# Patient Record
Sex: Male | Born: 1995 | Race: White | Hispanic: No | Marital: Single | Smoking: Current every day smoker
Health system: Southern US, Community
[De-identification: ages and names within clinical notes are randomized; demographics above are authoritative.]

---
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
## 2015-03-19 IMAGING — CR DG HAND COMPLETE 3+V*L*
5 series · 5 of 5 positions shown · non-contrast
Comparison: None.

CLINICAL DATA: Left hand pain.

EXAM:
LEFT HAND - COMPLETE 3+ VIEW

[view not recorded (1 of 5)]
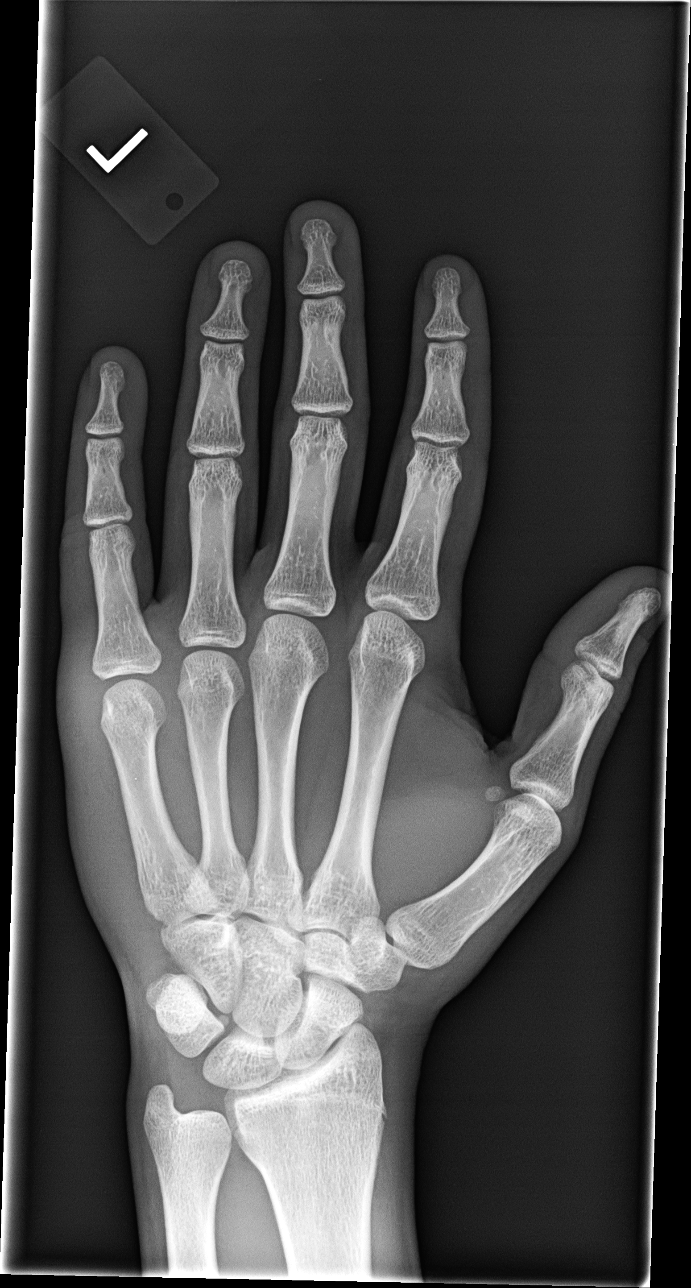

[view not recorded (2 of 5)]
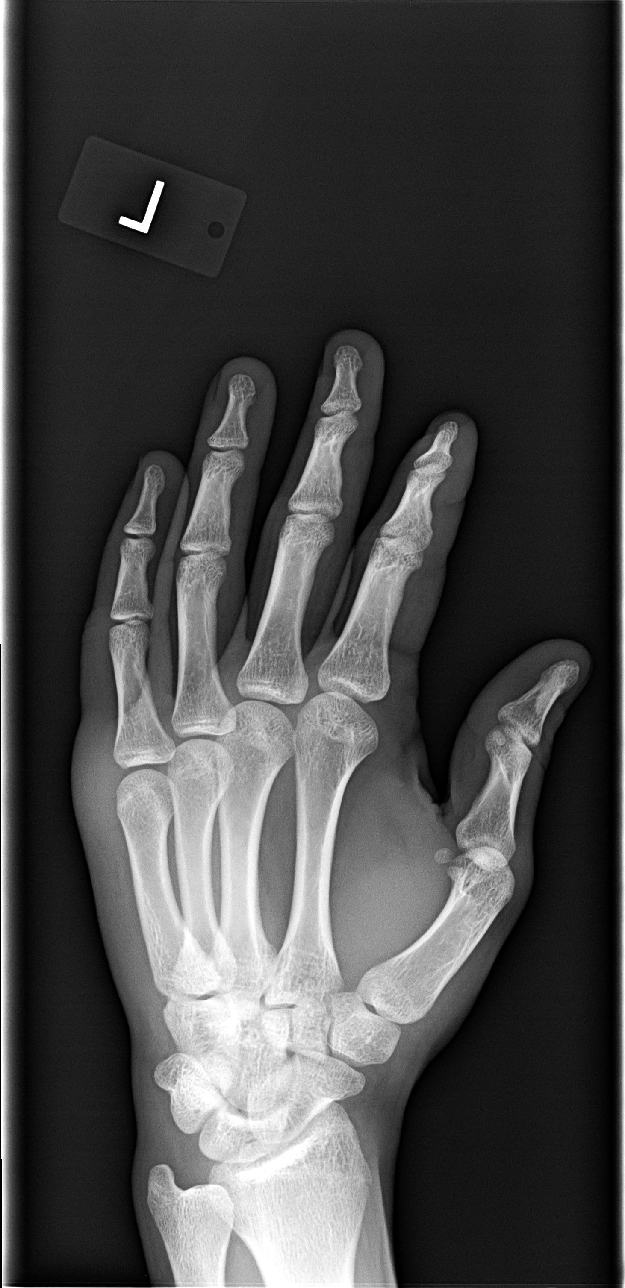

[view not recorded (3 of 5)]
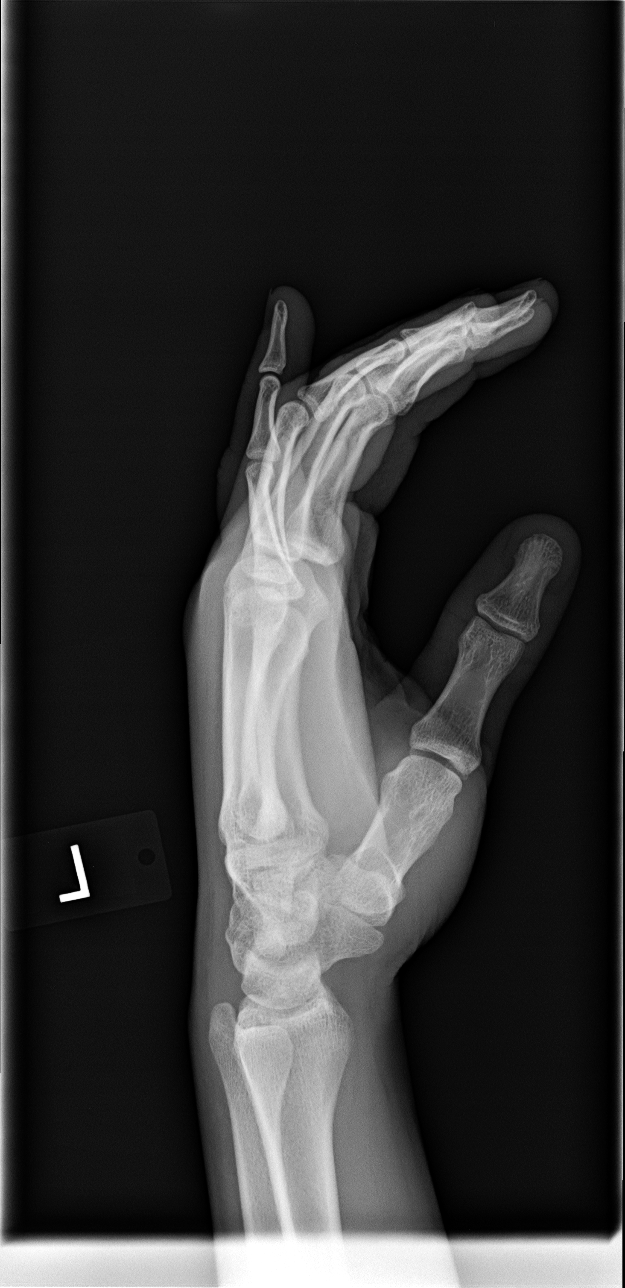

[view not recorded (4 of 5)]
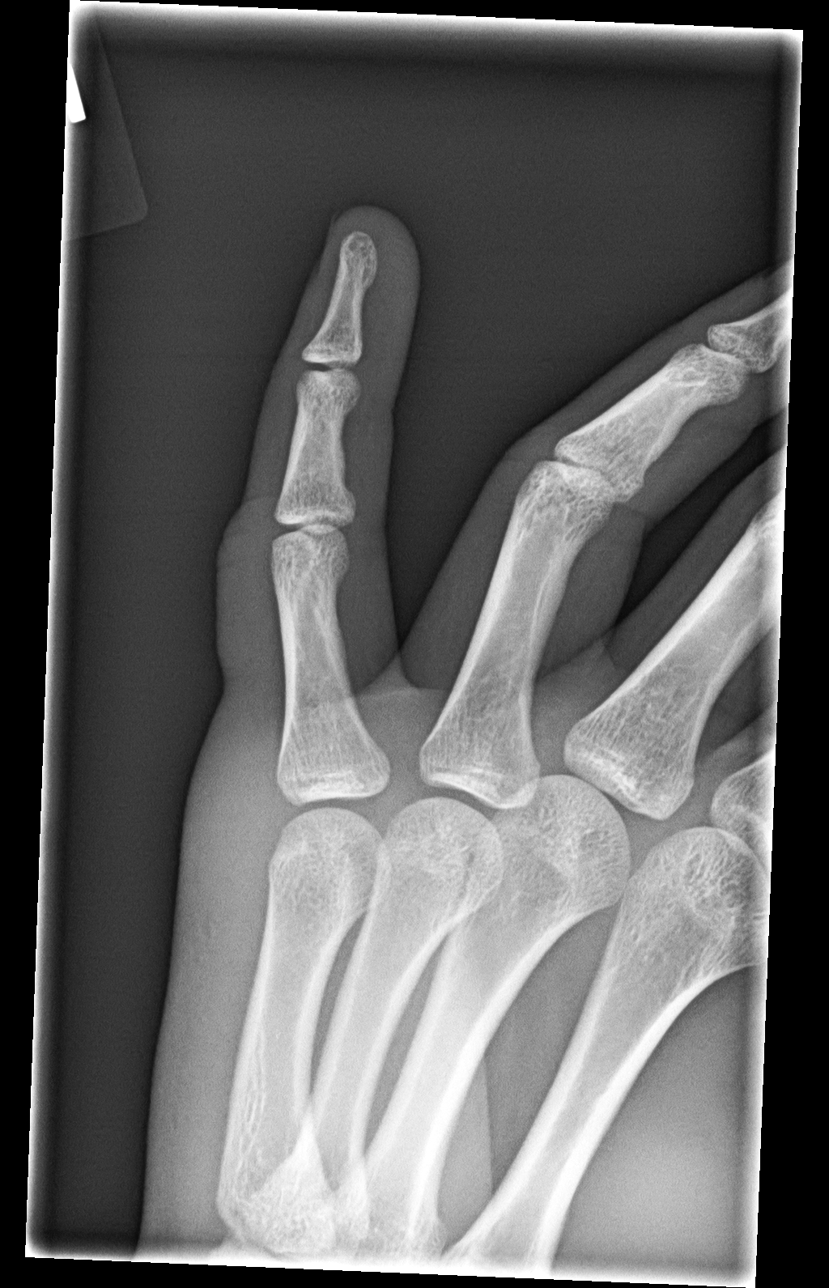

[view not recorded (5 of 5)]
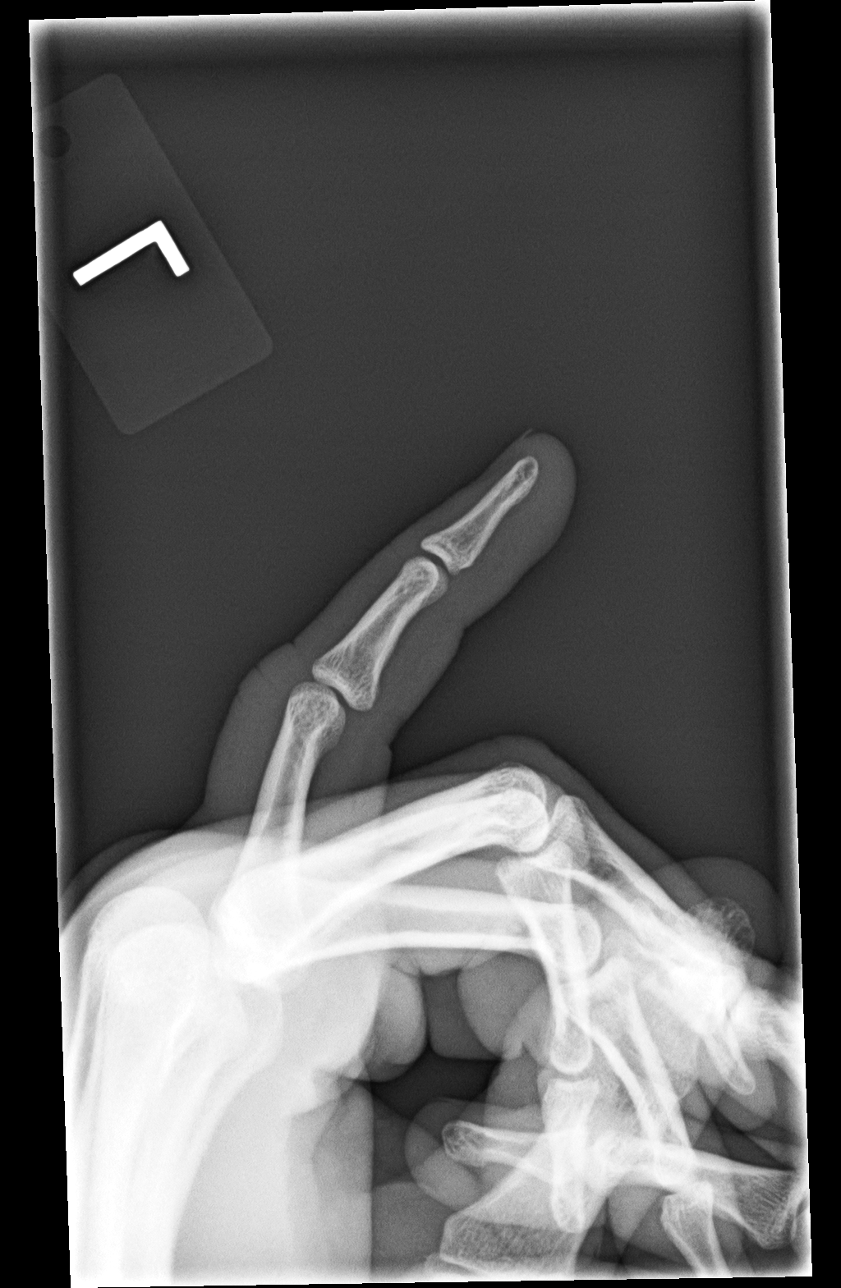

[5 of 5 positions shown; findings below may reference images not displayed]

FINDINGS: There is no evidence of fracture or dislocation. There is no
evidence of arthropathy or other focal bone abnormality. Soft
tissues are unremarkable.
IMPRESSION: Normal left hand.

## 2016-06-07 ENCOUNTER — Encounter: Payer: Self-pay | Admitting: Family

## 2016-06-07 ENCOUNTER — Ambulatory Visit (INDEPENDENT_AMBULATORY_CARE_PROVIDER_SITE_OTHER): Payer: Self-pay | Admitting: Family

## 2016-06-07 VITALS — BP 119/67 | HR 84 | Temp 98.2°F | Ht 71.59 in | Wt 158.6 lb

## 2016-06-07 DIAGNOSIS — G43009 Migraine without aura, not intractable, without status migrainosus: Secondary | ICD-10-CM

## 2016-06-07 DIAGNOSIS — S00401A Unspecified superficial injury of right ear, initial encounter: Secondary | ICD-10-CM

## 2016-06-07 DIAGNOSIS — W57XXXA Bitten or stung by nonvenomous insect and other nonvenomous arthropods, initial encounter: Secondary | ICD-10-CM

## 2016-06-07 MED ORDER — KETOROLAC TROMETHAMINE 60 MG/2ML IM SOLN
60.0000 mg | Freq: Once | INTRAMUSCULAR | Status: AC
  Administered 2016-06-07: 60 mg via INTRAMUSCULAR

## 2016-06-07 MED ORDER — DOXYCYCLINE HYCLATE 100 MG PO TABS: 200.0000 mg | ORAL_TABLET | Freq: Once | ORAL | Status: AC

## 2016-06-07 NOTE — Progress Notes (Signed)
   Subjective:    Patient ID: Bradley Schneider, male    DOB: 08/10/1996, 20 y.o.   MRN: [id number]  HPI Pt presents to the office today for a tick bite behind right ear. PT states he removed the tick two days. PT is unsure how long it was attached, but states he doesn't believe it was attached "too long". Pt denies any fever, rash, or joint pain. Pt states he has had a headache all day and has had nausea and vomiting with it. Pt states he use to get migraines as a child, but has not had one recently.    Review of Systems  Constitutional: Negative.   Respiratory: Negative.   Cardiovascular: Negative.   Gastrointestinal: Positive for nausea and vomiting.  Endocrine: Negative.   Genitourinary: Negative.   Musculoskeletal: Negative.   Neurological: Positive for headaches.  Hematological: Negative.   Psychiatric/Behavioral: Negative.   All other systems reviewed and are negative.      Objective:   Physical Exam  Constitutional: He is oriented to person, place, and time. He appears well-developed and well-nourished. No distress.  HENT:  Head: Normocephalic.  Eyes: Pupils are equal, round, and reactive to light. Right eye exhibits no discharge. Left eye exhibits no discharge.  Neck: Normal range of motion. Neck supple. No thyromegaly present.  Cardiovascular: Normal rate, regular rhythm, normal heart sounds and intact distal pulses.   No murmur heard. Pulmonary/Chest: Effort normal and breath sounds normal. No respiratory distress. He has no wheezes.  Abdominal: Soft. Bowel sounds are normal. He exhibits no distension. There is no tenderness.  Musculoskeletal: Normal range of motion. He exhibits no edema or tenderness.  Neurological: He is alert and oriented to person, place, and time.  Skin: Skin is warm and dry. No erythema.  Small scabbed lesion behind right ear, no redness, rash or drainage present  Psychiatric: He has a normal mood and affect. His behavior is normal. Judgment and  thought content normal.  Vitals reviewed.     BP 119/67 mmHg  Pulse 84  Temp(Src) 98.2 F (36.8 C) (Oral)  Ht 5' 11.59" (1.818 m)  Wt 158 lb 9.6 oz (71.94 kg)  BMI 21.77 kg/m2     Assessment & Plan:  1. Tick bite --Pt to report any new fever, joint pain, or rash -Wear protective clothing while outside- Long sleeves and long pants -Put insect repellent on all exposed skin and along clothing -Take a shower as soon as possible after being outside -RTO Prn - doxycycline (VIBRA-TABS) 100 MG tablet; Take 2 tablets (200 mg total) by mouth once.  Dispense: 2 tablet; Refill: 0  2. Migraine without aura and without status migrainosus, not intractable -Motrin prn OTC -Rest -Avoid caffeine, stress RTO prn - ketorolac (TORADOL) injection 60 mg; Inject 2 mLs (60 mg total) into the muscle once.  Jannifer Rodneyhristy Hawks, FNP

## 2016-06-07 NOTE — Patient Instructions (Signed)
Tick Bite Information Ticks are insects that attach themselves to the skin and draw blood for food. There are various types of ticks. Common types include wood ticks and deer ticks. Most ticks live in shrubs and grassy areas. Ticks can climb onto your body when you make contact with leaves or grass where the tick is waiting. The most common places on the body for ticks to attach themselves are the scalp, neck, armpits, waist, and groin. Most tick bites are harmless, but sometimes ticks carry germs that cause diseases. These germs can be spread to a person during the tick's feeding process. The chance of a disease spreading through a tick bite depends on:   The type of tick.  Time of year.   How long the tick is attached.   Geographic location.  HOW CAN YOU PREVENT TICK BITES? Take these steps to help prevent tick bites when you are outdoors:  Wear protective clothing. Long sleeves and long pants are best.   Wear white clothes so you can see ticks more easily.  Tuck your pant legs into your socks.   If walking on a trail, stay in the middle of the trail to avoid brushing against bushes.  Avoid walking through areas with long grass.  Put insect repellent on all exposed skin and along boot tops, pant legs, and sleeve cuffs.   Check clothing, hair, and skin repeatedly and before going inside.   Brush off any ticks that are not attached.  Take a shower or bath as soon as possible after being outdoors.  WHAT IS THE PROPER WAY TO REMOVE A TICK? Ticks should be removed as soon as possible to help prevent diseases caused by tick bites. 1. If latex gloves are available, put them on before trying to remove a tick.  2. Using fine-point tweezers, grasp the tick as close to the skin as possible. You may also use curved forceps or a tick removal tool. Grasp the tick as close to its head as possible. Avoid grasping the tick on its body. 3. Pull gently with steady upward pressure until  the tick lets go. Do not twist the tick or jerk it suddenly. This may break off the tick's head or mouth parts. 4. Do not squeeze or crush the tick's body. This could force disease-carrying fluids from the tick into your body.  5. After the tick is removed, wash the bite area and your hands with soap and water or other disinfectant such as alcohol. 6. Apply a small amount of antiseptic cream or ointment to the bite site.  7. Wash and disinfect any instruments that were used.  Do not try to remove a tick by applying a hot match, petroleum jelly, or fingernail polish to the tick. These methods do not work and may increase the chances of disease being spread from the tick bite.  WHEN SHOULD YOU SEEK MEDICAL CARE? Contact your health care provider if you are unable to remove a tick from your skin or if a part of the tick breaks off and is stuck in the skin.  After a tick bite, you need to be aware of signs and symptoms that could be related to diseases spread by ticks. Contact your health care provider if you develop any of the following in the days or weeks after the tick bite:  Unexplained fever.  Rash. A circular rash that appears days or weeks after the tick bite may indicate the possibility of Lyme disease. The rash may resemble   a target with a bull's-eye and may occur at a different part of your body than the tick bite.  Redness and swelling in the area of the tick bite.   Tender, swollen lymph glands.   Diarrhea.   Weight loss.   Cough.   Fatigue.   Muscle, joint, or bone pain.   Abdominal pain.   Headache.   Lethargy or a change in your level of consciousness.  Difficulty walking or moving your legs.   Numbness in the legs.   Paralysis.  Shortness of breath.   Confusion.   Repeated vomiting.    This information is not intended to replace advice given to you by your health care provider. Make sure you discuss any questions you have with your health  care provider.   Document Released: 11/23/2000 Document Revised: 12/17/2014 Document Reviewed: 05/06/2013 [employer] Interactive Patient Education 2016 [employer]
